# Patient Record
Sex: Male | Born: 1970 | Race: White | Hispanic: No | Marital: Married
Health system: Southern US, Community
[De-identification: ages and names within clinical notes are randomized; demographics above are authoritative.]

---

## 2007-07-28 ENCOUNTER — Emergency Department: Payer: Self-pay | Admitting: Emergency Medicine

## 2007-08-08 ENCOUNTER — Ambulatory Visit (HOSPITAL_BASED_OUTPATIENT_CLINIC_OR_DEPARTMENT_OTHER): Admission: RE | Admit: 2007-08-08 | Discharge: 2007-08-08 | Payer: Self-pay | Admitting: Orthopedic Surgery

## 2007-08-18 ENCOUNTER — Other Ambulatory Visit: Payer: Self-pay

## 2007-08-19 ENCOUNTER — Inpatient Hospital Stay: Payer: Self-pay | Admitting: Internal Medicine

## 2007-08-22 ENCOUNTER — Other Ambulatory Visit: Payer: Self-pay

## 2008-03-26 ENCOUNTER — Ambulatory Visit: Payer: Self-pay | Admitting: Vascular Surgery

## 2010-01-21 IMAGING — CT CT CHEST W/ CM
1 series · 15 of 32 positions shown, 19 images · non-contrast
Comparison: none

REASON FOR EXAM: cp sob known dvt
COMMENTS:

[Series 5: soft tissue · axial · 0.74mm/px · z∈[+974,+1280]mm · 15 of 116 slices shown, 19 images]
[im 9/116  mediastinal]
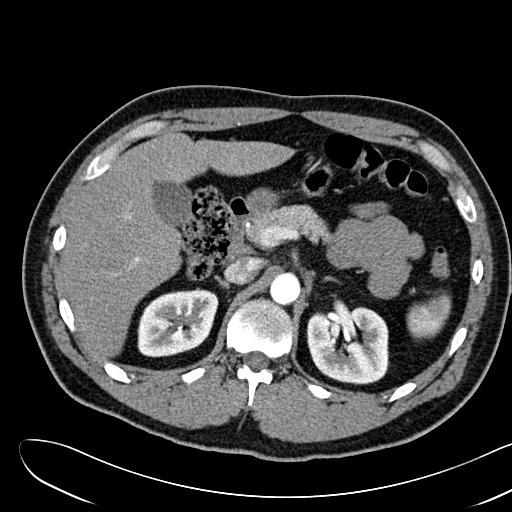
[im 9/116  lung]
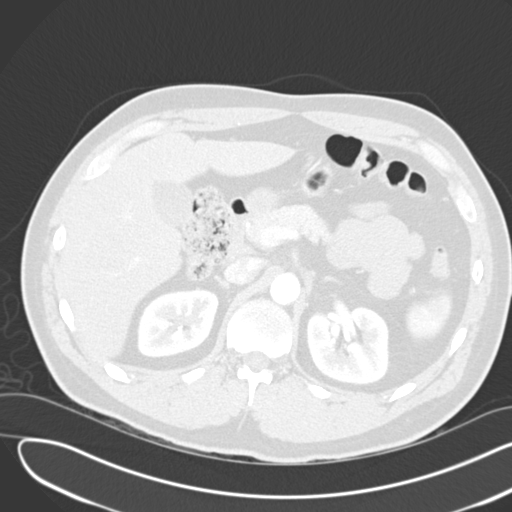
[im 18/116  lung]
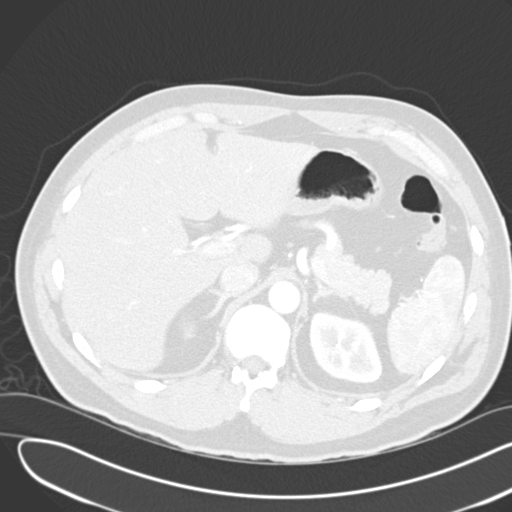
[im 24/116  lung]
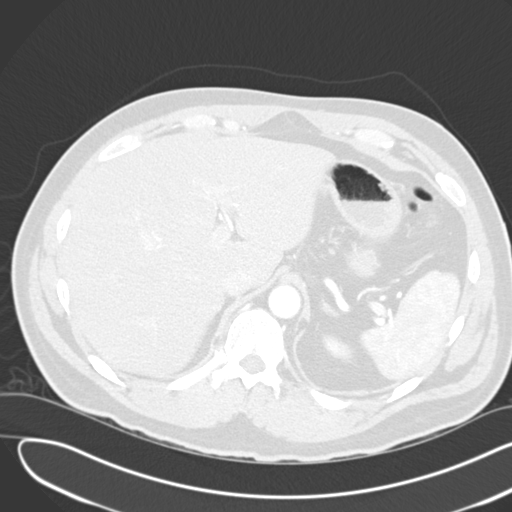
[im 30/116  lung]
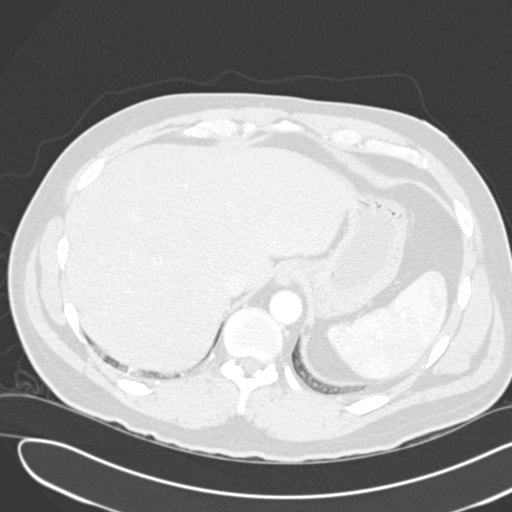
[im 39/116  mediastinal]
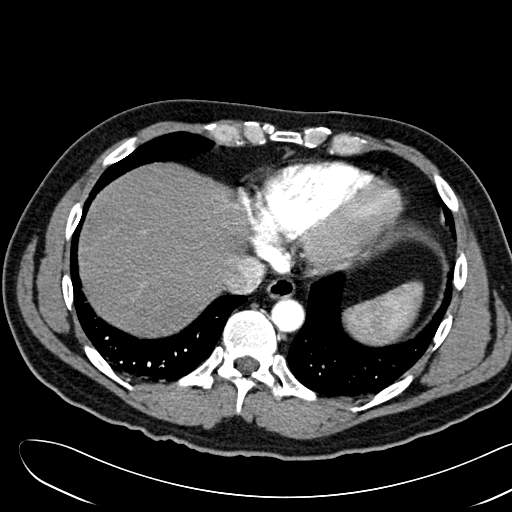
[im 39/116  lung]
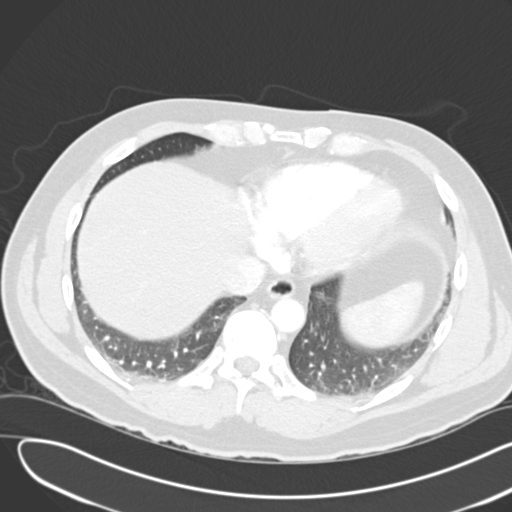
[im 47/116  lung]
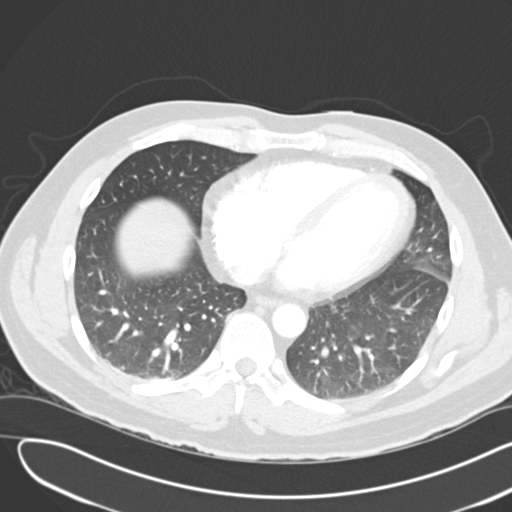
[im 56/116  lung]
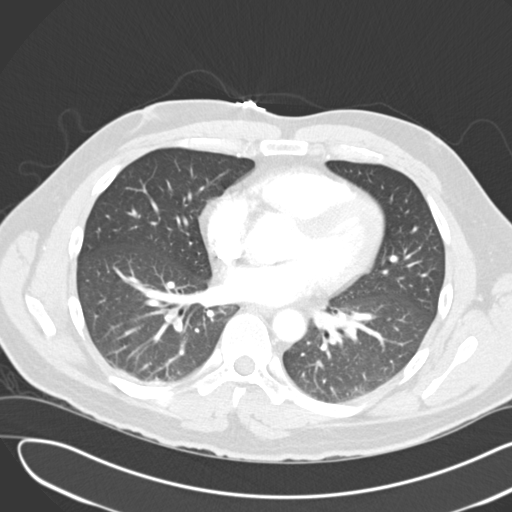
[im 61/116  lung]
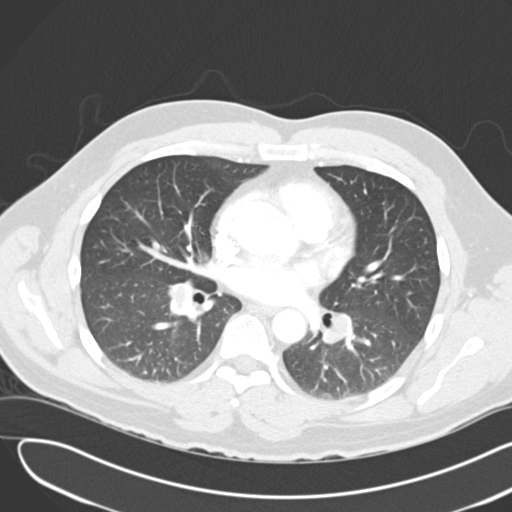
[im 69/116  mediastinal]
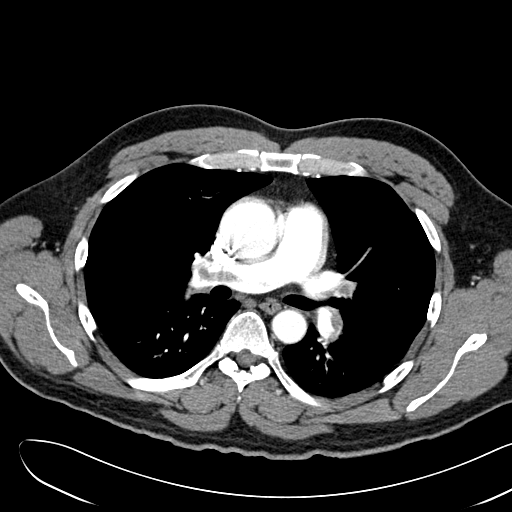
[im 69/116  lung]
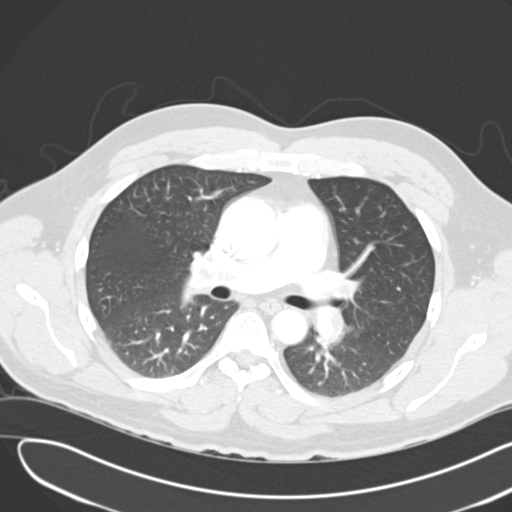
[im 73/116  lung]
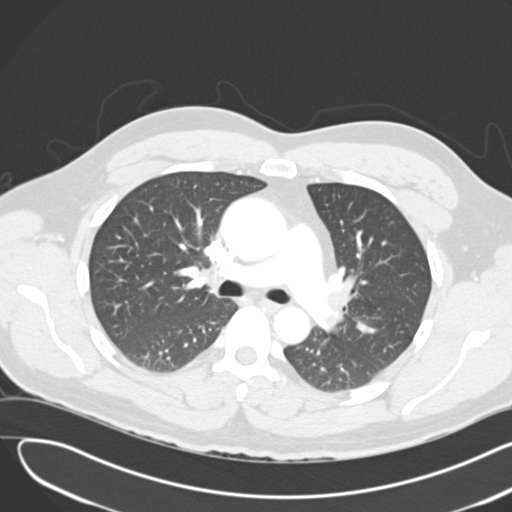
[im 81/116  lung]
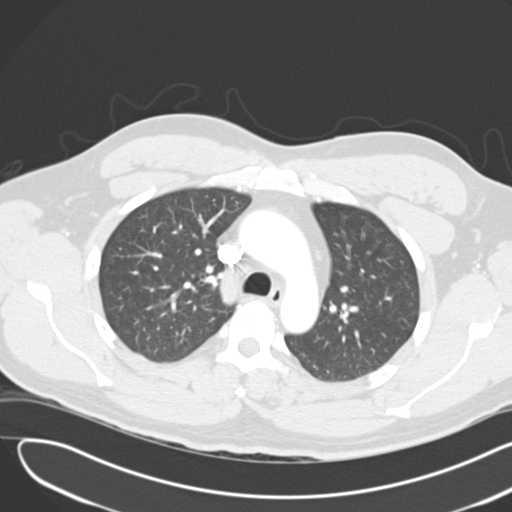
[im 90/116  lung]
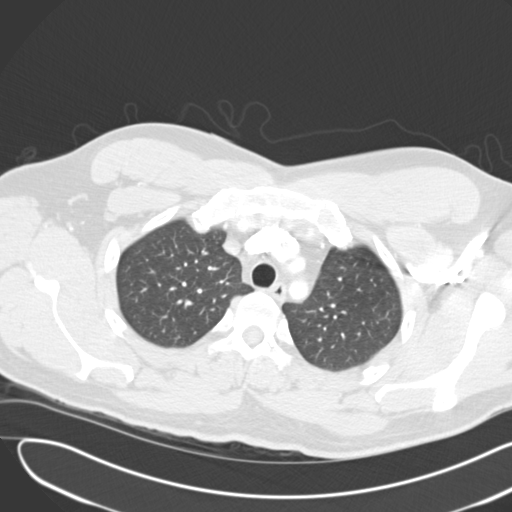
[im 94/116  mediastinal]
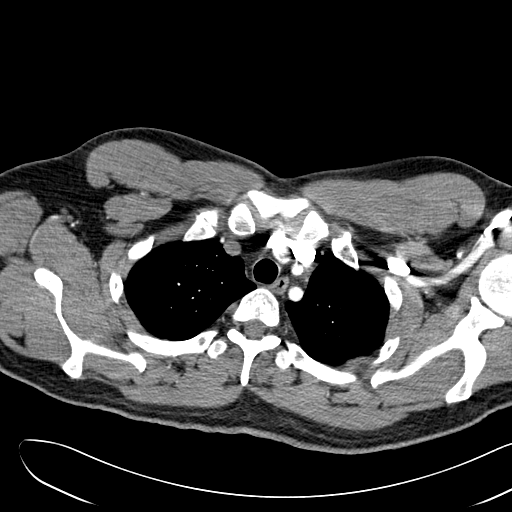
[im 94/116  lung]
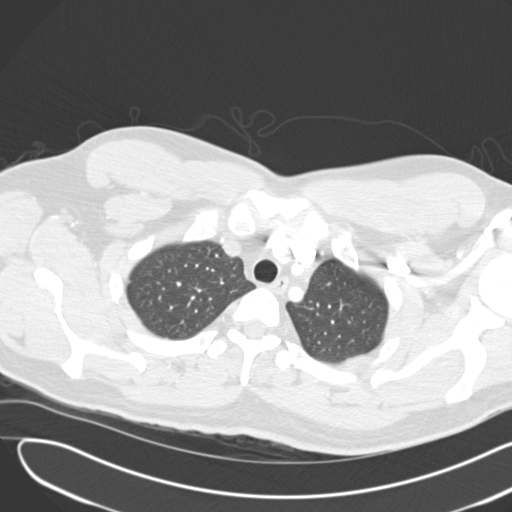
[im 103/116  lung]
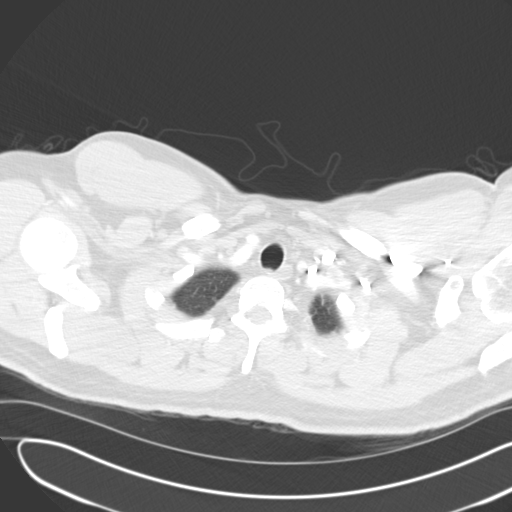
[im 111/116  lung]
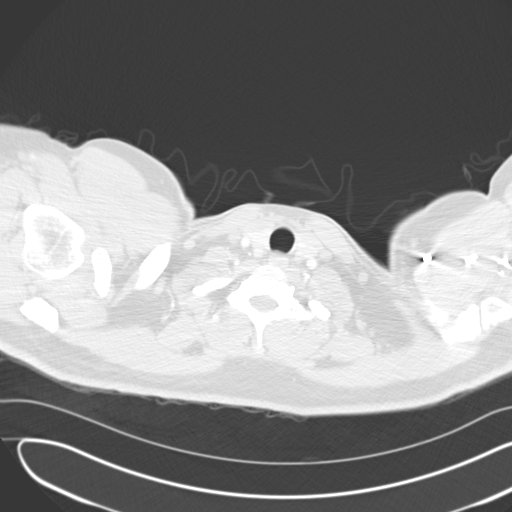

[15 of 32 positions shown; findings below may reference images not displayed]

PROCEDURE:     CT  - CT CHEST (FOR PE) W  - August 19, 2007 [DATE]

RESULT:     The patient received 125 ml of Ksovue-KBZ. The patient is
postoperative from LEFT foot surgery and is complaining of dyspnea and
shortness of breath.

There are filling defects within the central and more peripheral pulmonary
arterial tree bilaterally consistent with acute pulmonary embolism. At lung
window settings there is no evidence of pulmonary infarction. There is no
alveolar or interstitial infiltrate otherwise either. The cardiac chambers
are top normal in size. The caliber of the ascending thoracic aorta is
mildly dilated at 4.3 cm at the aortic root. The aortic arch and descending
thoracic aorta are normal in appearance. I see no pleural or pericardial
effusions. Within the upper abdomen the observed portions of the liver and
spleen are normal. The kidneys enhance well where visualized.
IMPRESSION: CV:1.There are bilateral central and peripheral pulmonary emboli. A very
thin filling defect extending into both main pulmonary arteries is seen
consistent with a small saddle embolus. I do not see objective evidence of
pulmonary infarction currently. A trace of atelectasis in the posterior
costophrenic gutter on the RIGHT is noted.
2. There is mild dilation of the aortic root at 4.3 cm.
3. I see no acute abnormality elsewhere within the thorax.

A preliminary report was sent to the [HOSPITAL] the conclusion
of the study.

## 2010-10-20 NOTE — Op Note (Signed)
NAMERACER, QUAM NO.:  [id number]   MEDICAL RECORD NO.:  [id number]          PATIENT TYPE:  AMB   LOCATION:  DSC                          FACILITY:  [facility]   PHYSICIAN:  Leonides Grills, M.D.     DATE OF BIRTH:  [birth year]   DATE OF PROCEDURE:  08/08/2007  DATE OF DISCHARGE:                               OPERATIVE REPORT   PREOPERATIVE DIAGNOSIS:  Left Achilles tendon rupture.   POSTOPERATIVE DIAGNOSIS:  Left Achilles tendon rupture.   OPERATIONS:  1. Left primary repair to Achilles tendon.  2. Left plantaris to Achilles tendon transfer.   ANESTHESIA:  General.   SURGEON:  Durene Romans. Lestine Box, MD   ASSISTANT:  Richardean Canal, P.A.   ESTIMATED BLOOD LOSS:  Minimal.   TOURNIQUET TIME:  Approximately one hour.   COMPLICATIONS:  None.   DISPOSITION:  Stable to PR.   INDICATIONS:  This is a 40 year old male who has sustained the above  injury.  He was sent for the above procedure.  All risks including  infection, neurovessel injury, re-rupture, persistent pain, weakness,  contracture were all explained and questions were encouraged and  answered.   DESCRIPTION OF PROCEDURE:  The patient was brought to the operating room  and placed in supine position, after adequate general endotracheal tube  anesthesia was administered as well as Ancef gram IV piggyback.  He was  placed into a full-lateral position with the operative site down on a  bean bag.  All bony prominences were well padded, and the left lower  extremity was then prepped and draped in a sterile manner, with a  proximally placed thigh tourniquet.  The limb was gravity exsanguinated  and the tourniquet was elevated to 290 mmHg.  A longitudinal incision on  the anteromedial aspect of the left Achilles tendon was then made.  Dissection was carried down through skin.  Hemostasis was obtained.  The  fascia was opened in line with the incision.  The Achilles tendon  ruptured area was found, and the  plantaris was also found as well; this  was intact.  We dissected out the plantaris for later transfer.  We then  carefully dissected the Achilles tendon rupture, and then with a #5  FiberWire with a modified Bunnell stitch repaired end-to-end the  Achilles tendon rupture.  This had an Conservation officer, historic buildings.   The remaining part of the Achilles tendon mopped areas were repaired  with 2-0 Vicryl stitch, and then the plantaris tendon was transferred to  the Achilles tendon and then using 2-0 FiberWire stitch.  This had an  Conservation officer, historic buildings.   The construct was ranged and had good range of motion as well; and  appeared to be strong and intact.  The area was copiously irrigated with  normal saline.  The tourniquet was deflated.  Hemostasis was obtained.  The subcutaneous was closed with 3-0 Vicryl.  The skin was closed with 4-  0 nylon.  Sterile dressing was applied.  Modified Jones dressing was  applied, with the ankle in gravity equinus.  The patient was stable to  the PR.  Leonides Grills, M.D.  Electronically Signed     PB/MEDQ  D:  08/08/2007  T:  08/08/2007  Job:  [id number]

## 2012-04-25 ENCOUNTER — Emergency Department: Payer: Self-pay | Admitting: Emergency Medicine

## 2014-09-28 IMAGING — US US EXTREM LOW VENOUS*L*
1 series · 14 of 24 positions shown · non-contrast
Comparison: none

REASON FOR EXAM: pain/edema left calf. PMH of DVT
COMMENTS:   LMP: (Male)

[Series 1: us extrem low venous*left* · 0.11mm/px · 14 of 24 slices shown]
[im 1/24]
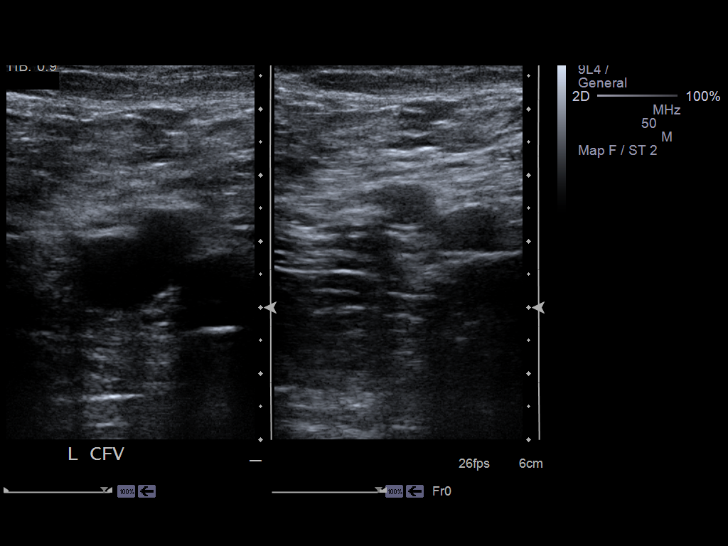
[im 3/24]
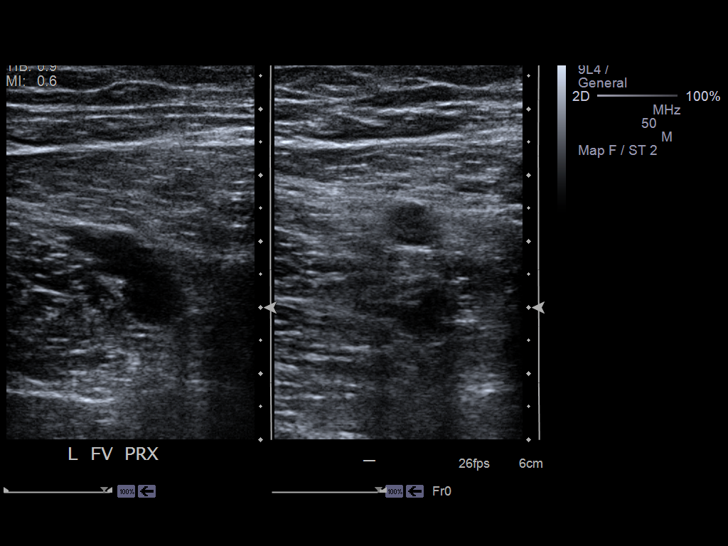
[im 5/24]
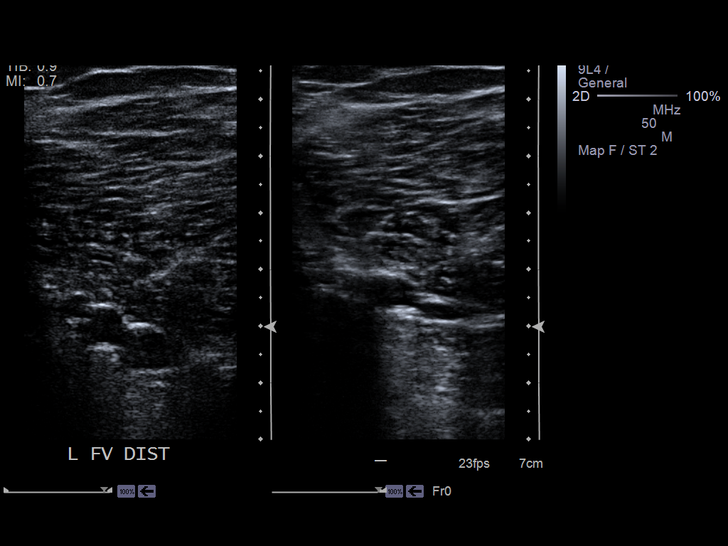
[im 7/24]
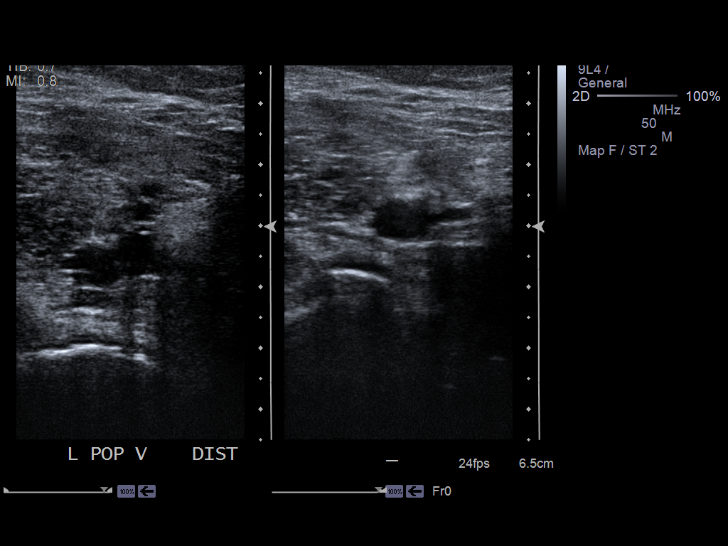
[im 8/24]
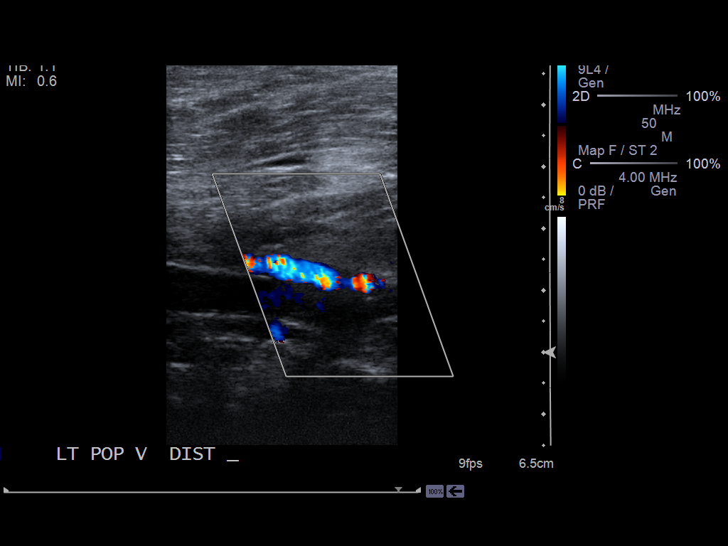
[im 10/24]
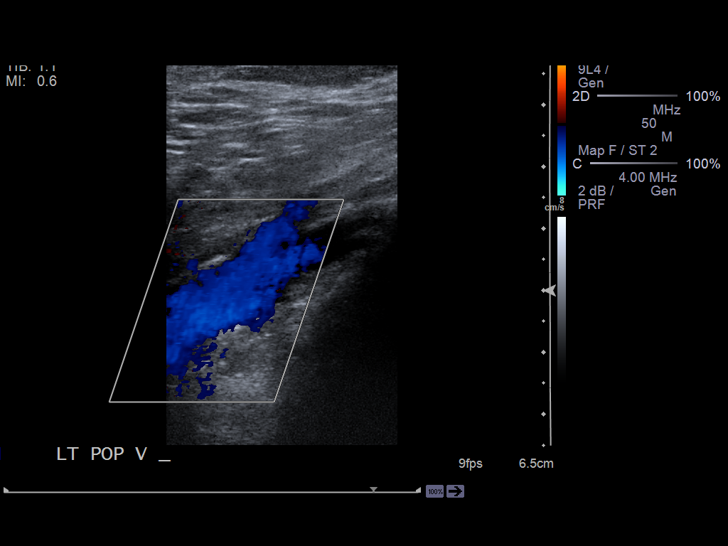
[im 12/24]
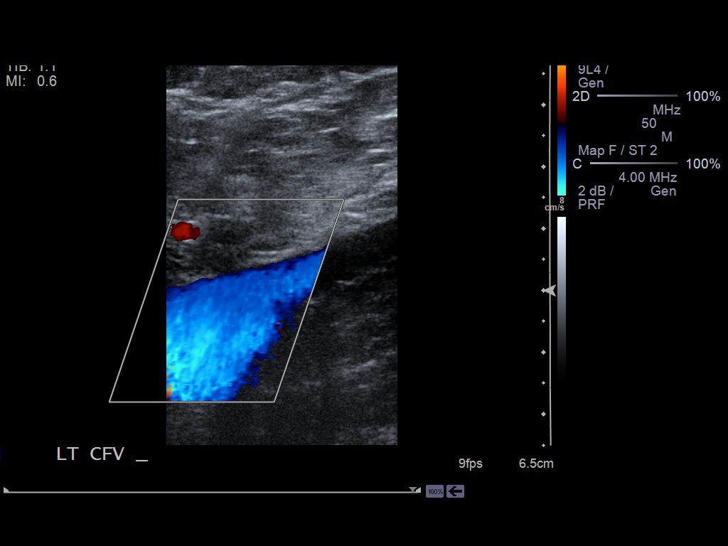
[im 13/24]
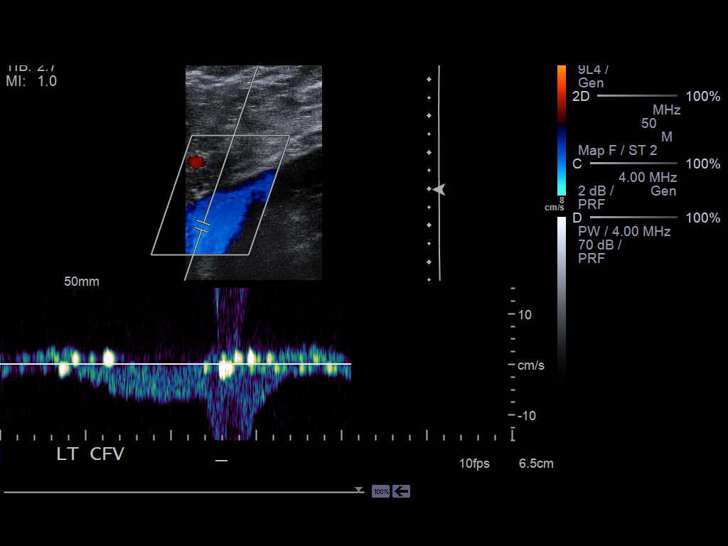
[im 15/24]
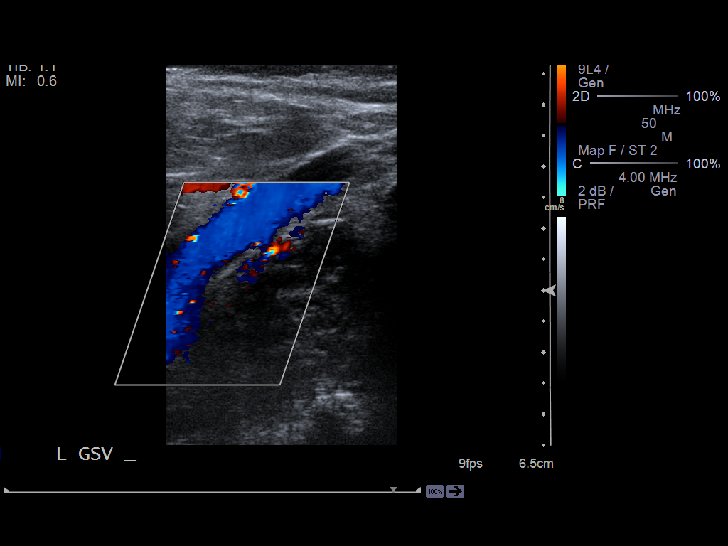
[im 17/24]
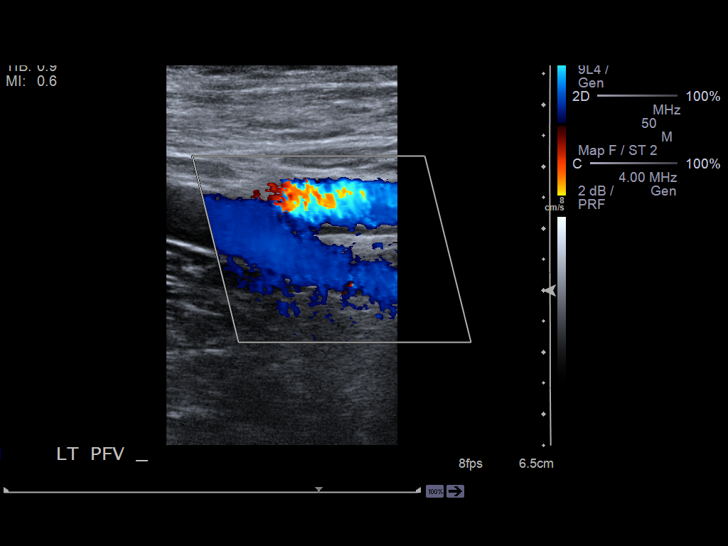
[im 19/24]
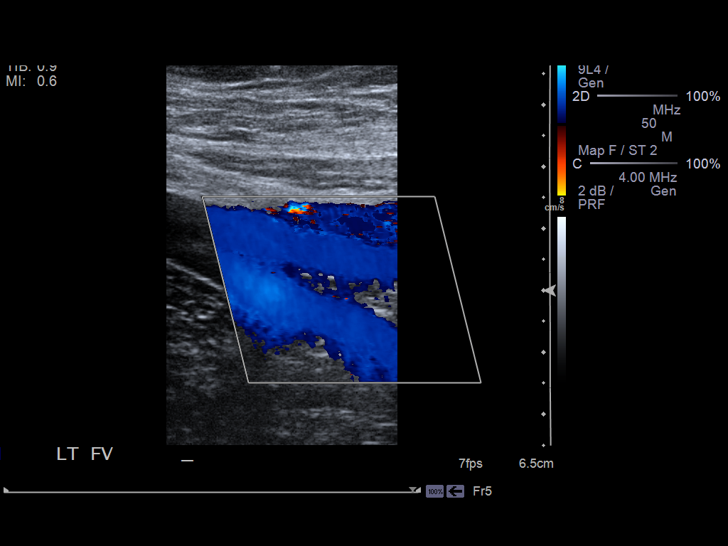
[im 20/24]
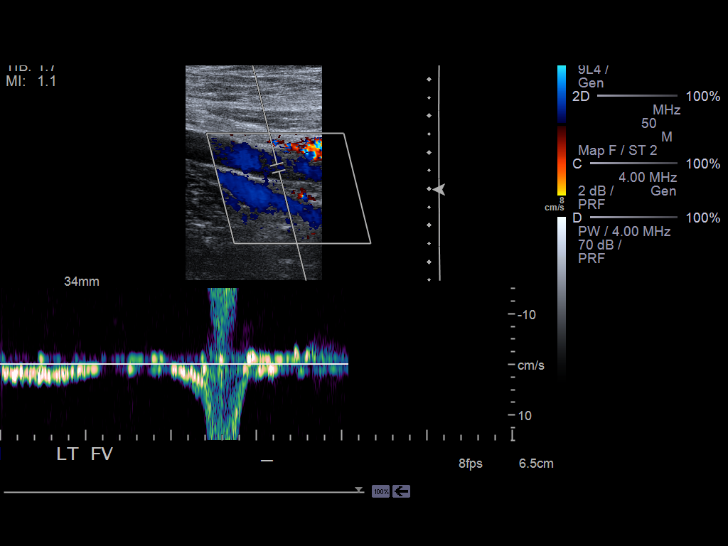
[im 22/24]
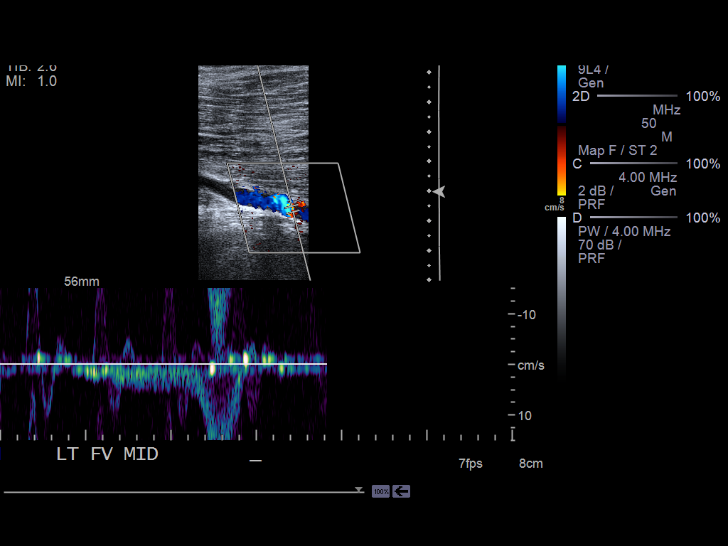
[im 24/24]
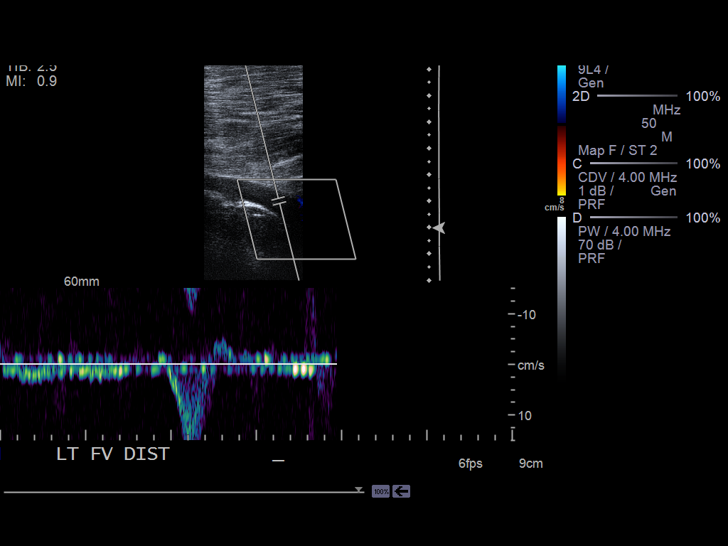

[14 of 24 positions shown; findings below may reference images not displayed]

PROCEDURE:     US  - US DOPPLER LOW EXTR LEFT  - April 25, 2012 [DATE]

RESULT:     Comparison: None

Technique and findings: Multiple longitudinal and transverse grayscale as
well as color and spectral Doppler images of the left lower extremity veins
were obtained from the common femoral veins through the popliteal veins.

The left common femoral, femoral, and popliteal veins are patent,
demonstrating normal color-flow and compressibility. No intraluminal
thrombus is identified.  There is normal respiratory variation and
augmentation demonstrated at all vein levels.
IMPRESSION: No evidence of DVT in the left lower extremity.

[REDACTED]

## 2015-04-25 ENCOUNTER — Encounter: Payer: Self-pay | Admitting: Physician Assistant

## 2015-04-25 ENCOUNTER — Ambulatory Visit: Payer: Self-pay | Admitting: Physician Assistant

## 2015-04-25 VITALS — BP 130/90 | HR 83 | Temp 98.5°F

## 2015-04-25 DIAGNOSIS — J019 Acute sinusitis, unspecified: Secondary | ICD-10-CM

## 2015-04-25 MED ORDER — AMOXICILLIN 875 MG PO TABS: 875.0000 mg | ORAL_TABLET | Freq: Two times a day (BID) | ORAL | Status: DC

## 2015-04-25 NOTE — Progress Notes (Signed)
S/ one wk hx nasal congestion , pressure blowing green , malaise ST ,  O/ VSS mildly ill appearing tms with fluid , nasal mucosa very red , inflames with purulent d/c , turbinates boggy , + frontomaxillary tenderness  Throat clear neck supple heart rsr lungs clear  A. rhinosinusitis P / amoxicillan , supportive care,  Nasal saline products bid and prn

## 2015-07-23 ENCOUNTER — Encounter: Payer: Self-pay | Admitting: Physician Assistant

## 2015-07-23 ENCOUNTER — Ambulatory Visit: Payer: Self-pay | Admitting: Physician Assistant

## 2015-07-23 VITALS — BP 129/80 | HR 82 | Temp 98.3°F

## 2015-07-23 DIAGNOSIS — J069 Acute upper respiratory infection, unspecified: Secondary | ICD-10-CM

## 2015-07-23 MED ORDER — CEFDINIR 300 MG PO CAPS: 300.0000 mg | ORAL_CAPSULE | Freq: Two times a day (BID) | ORAL | Status: DC

## 2015-07-23 NOTE — Progress Notes (Signed)
S: C/o runny nose and congestion for 3 days, ? fever, chills, and body aches, denies cp/sob, v/d; mucus was green this am but clear throughout the day, cough is sporadic, cough is worse at night, did get flu vaccine   O: PE: vitals wnl, nad,  perrl eomi, normocephalic, tms dull, nasal mucosa red and swollen, throat injected, neck supple no lymph, lungs c t a, cv rrr, neuro intact  A:  Acute uri   P: omnicef, otc delsym, if cough is worsening can call in tessalon, drink fluids, continue regular meds , use otc meds of choice, return if not improving in 5 days, return earlier if worsening

## 2015-08-13 ENCOUNTER — Ambulatory Visit: Payer: Self-pay | Admitting: Registered Nurse

## 2015-08-13 ENCOUNTER — Encounter: Payer: Self-pay | Admitting: Registered Nurse

## 2015-08-13 VITALS — BP 128/90 | HR 82 | Temp 98.8°F

## 2015-08-13 DIAGNOSIS — IMO0001 Reserved for inherently not codable concepts without codable children: Secondary | ICD-10-CM | POA: Insufficient documentation

## 2015-08-13 DIAGNOSIS — E785 Hyperlipidemia, unspecified: Secondary | ICD-10-CM | POA: Insufficient documentation

## 2015-08-13 DIAGNOSIS — L255 Unspecified contact dermatitis due to plants, except food: Secondary | ICD-10-CM

## 2015-08-13 DIAGNOSIS — I10 Essential (primary) hypertension: Secondary | ICD-10-CM | POA: Insufficient documentation

## 2015-08-13 DIAGNOSIS — I2699 Other pulmonary embolism without acute cor pulmonale: Secondary | ICD-10-CM | POA: Insufficient documentation

## 2015-08-13 DIAGNOSIS — G4730 Sleep apnea, unspecified: Secondary | ICD-10-CM | POA: Insufficient documentation

## 2015-08-13 DIAGNOSIS — S86019A Strain of unspecified Achilles tendon, initial encounter: Secondary | ICD-10-CM | POA: Insufficient documentation

## 2015-08-13 DIAGNOSIS — IMO0002 Reserved for concepts with insufficient information to code with codable children: Secondary | ICD-10-CM | POA: Insufficient documentation

## 2015-08-13 MED ORDER — PREDNISONE 10 MG PO TABS: 60.0000 mg | ORAL_TABLET | Freq: Every day | ORAL | Status: AC

## 2015-08-13 NOTE — Progress Notes (Signed)
Subjective:    Patient ID: Ronald Christensen, male    DOB: [birth year], 45 y.o.   MRN: [id number]  HPI Comments: Married caucasian male helped with yard work at [employer] two weeks ago wore gloves/pants and short sleeve shirt noticed rash on bilateral arms started at wrists and has spread to shoulders bilaterally and abdomen.  Clear discharge/pimples noted by patient some grouped.  Has tried calamine lotion, benadryl 12.5mg  po prn, poison ivy scrub without improvement of symptoms.  Required po steroids a couple years ago for similar spread of rash.  Thinks this is poison ivy but didn't recognize plant in area he was working outside.  Poison [place] This is a recurrent problem. The current episode started 1 to 4 weeks ago. The problem has been gradually worsening since onset. The affected locations include the abdomen, left arm and right arm. The rash is characterized by redness, itchiness, draining, swelling and scaling. He was exposed to plant contact. Pertinent negatives include no anorexia, congestion, cough, diarrhea, eye pain, facial edema, fatigue, fever, joint pain, nail changes, rhinorrhea, shortness of breath, sore throat or vomiting. Past treatments include analgesics, antihistamine, anti-itch cream, moisturizer and cold compress. The treatment provided mild relief. His past medical history is significant for allergies and varicella. There is no history of asthma or eczema.      Review of Systems  Constitutional: Negative for fever, chills, diaphoresis, activity change, appetite change, fatigue and unexpected weight change.  HENT: Negative for congestion, dental problem, drooling, ear discharge, ear pain, facial swelling, hearing loss, mouth sores, nosebleeds, postnasal drip, rhinorrhea, sinus pressure, sneezing, sore throat, tinnitus, trouble swallowing and voice change.   Eyes: Negative for photophobia, pain, discharge, redness, itching and visual disturbance.  Respiratory: Negative for  cough, choking, chest tightness, shortness of breath, wheezing and stridor.   Cardiovascular: Negative for chest pain, palpitations and leg swelling.  Gastrointestinal: Negative for nausea, vomiting, abdominal pain, diarrhea, constipation, blood in stool, abdominal distention and anorexia.  Endocrine: Negative for cold intolerance and heat intolerance.  Genitourinary: Negative for dysuria.  Musculoskeletal: Negative for myalgias, back pain, joint pain, joint swelling, arthralgias, gait problem, neck pain and neck stiffness.  Skin: Positive for color change and rash. Negative for nail changes, pallor and wound.  Allergic/Immunologic: Positive for environmental allergies. Negative for food allergies and immunocompromised state.  Neurological: Negative for dizziness, tremors, seizures, syncope, facial asymmetry, speech difficulty, weakness, light-headedness, numbness and headaches.  Hematological: Negative for adenopathy. Does not bruise/bleed easily.  Psychiatric/Behavioral: Negative for behavioral problems, confusion, sleep disturbance and agitation.       Objective:   Physical Exam  Constitutional: He is oriented to person, place, and time. Vital signs are normal. He appears well-developed and well-nourished. He is active and cooperative.  Non-toxic appearance. He does not have a sickly appearance. He does not appear ill. No distress.  HENT:  Head: Normocephalic and atraumatic.  Right Ear: Hearing, external ear and ear canal normal.  Left Ear: Hearing, external ear and ear canal normal.  Nose: Mucosal edema and rhinorrhea present. No nose lacerations, sinus tenderness, nasal deformity, septal deviation or nasal septal hematoma. No epistaxis.  No foreign bodies. Right sinus exhibits no maxillary sinus tenderness and no frontal sinus tenderness. Left sinus exhibits no maxillary sinus tenderness and no frontal sinus tenderness.  Mouth/Throat: Uvula is midline and mucous membranes are normal.  Mucous membranes are not pale, not dry and not cyanotic. He does not have dentures. No oral lesions. No trismus in the jaw. Normal  dentition. No dental abscesses, uvula swelling, lacerations or dental caries. Posterior oropharyngeal edema and posterior oropharyngeal erythema present. No oropharyngeal exudate or tonsillar abscesses.  Cobblestoning posterior pharynx; bilateral nasal turbinates with edema/erythema  Eyes: Conjunctivae, EOM and lids are normal. Pupils are equal, round, and reactive to light. Right eye exhibits no chemosis, no discharge, no exudate and no hordeolum. No foreign body present in the right eye. Left eye exhibits no chemosis, no discharge, no exudate and no hordeolum. No foreign body present in the left eye. Right conjunctiva is not injected. Right conjunctiva has no hemorrhage. Left conjunctiva is not injected. Left conjunctiva has no hemorrhage. No scleral icterus. Right eye exhibits normal extraocular motion and no nystagmus. Left eye exhibits normal extraocular motion and no nystagmus. Right pupil is round and reactive. Left pupil is round and reactive. Pupils are equal.  Neck: Trachea normal and normal range of motion. Neck supple. No tracheal tenderness, no spinous process tenderness and no muscular tenderness present. No rigidity. No tracheal deviation, no edema, no erythema and normal range of motion present. No thyroid mass and no thyromegaly present.  Cardiovascular: Normal rate, regular rhythm, S1 normal, S2 normal, normal heart sounds and intact distal pulses.  PMI is not displaced.  Exam reveals no gallop and no friction rub.   No murmur heard. Pulmonary/Chest: Effort normal and breath sounds normal. No stridor. No respiratory distress. He has no decreased breath sounds. He has no wheezes. He has no rhonchi. He has no rales.  Abdominal: Soft. He exhibits no distension.  Musculoskeletal: Normal range of motion. He exhibits no edema or tenderness.  Lymphadenopathy:        Head (right side): No submental, no submandibular, no tonsillar, no preauricular, no posterior auricular and no occipital adenopathy present.       Head (left side): No submental, no submandibular, no tonsillar, no preauricular, no posterior auricular and no occipital adenopathy present.    He has no cervical adenopathy.       Right cervical: No superficial cervical, no deep cervical and no posterior cervical adenopathy present.      Left cervical: No superficial cervical, no deep cervical and no posterior cervical adenopathy present.  Neurological: He is alert and oriented to person, place, and time. He displays no atrophy and no tremor. No cranial nerve deficit or sensory deficit. He exhibits normal muscle tone. He displays no seizure activity. Coordination and gait normal. GCS eye subscore is 4. GCS verbal subscore is 5. GCS motor subscore is 6.  Skin: Skin is warm, dry and intact. Rash noted. No abrasion, no bruising, no burn, no ecchymosis, no laceration, no lesion, no petechiae and no purpura noted. Rash is macular, papular and maculopapular. Rash is not nodular, not pustular, not vesicular and not urticarial. He is not diaphoretic. There is erythema. No cyanosis. No pallor. Nails show no clubbing.     Bilateral arms with grouped dry erythematous macular papular rash largest 3x2cm; nongrouped erythematous papules on abdomen scant  Psychiatric: He has a normal mood and affect. His speech is normal and behavior is normal. Judgment and thought content normal. Cognition and memory are normal.  Nursing note and vitals reviewed.         Assessment & Plan:  A-contact dermatitis due to plants P-60mg  prednisone po taper x 21 days  Widespread greater than 20% body surface area.  1-2mg /kg Prednisone (max ) for 7-10 days and taper over next 7-10 days per Up to Date.  Symptomatic therapy suggested e.g. Calamine  lotion prn affected areas, benadryl 25mg  po TID prn or OTC zyrtec 10mg  po BID.  Warm to  cool water soaks and/or oatmeal baths.  Call or return to clinic as needed if these symptoms worsen or fail to improve as anticipated especially lesions noted on eye, visual changes or visual loss.  Discussed avoidance/no contact wear of long sleeves/pants/socks/gloves and handkerchief around neck/mouth/face and use of poison ivy block cream along with tepid shower immediately after completion of yard work/playing in yard.  Keep poison ivy block lotion and soap at home as exposure likely to occur again. Avoid scratching lesions to prevent secondary infections.  May apply ice to itchy areas if po/topical meds not yet active systemically or wearing off prior to next dose.  Patient verbalized agreement and understanding of treatment plan and had no further questions at this time.   P2:  Avoidance and hand washing.

## 2015-11-21 ENCOUNTER — Ambulatory Visit: Payer: Self-pay | Admitting: Physician Assistant

## 2015-11-21 ENCOUNTER — Encounter: Payer: Self-pay | Admitting: Physician Assistant

## 2015-11-21 VITALS — BP 120/70 | HR 78 | Temp 98.4°F | Ht 71.0 in | Wt 264.0 lb

## 2015-11-21 DIAGNOSIS — Z0289 Encounter for other administrative examinations: Secondary | ICD-10-CM

## 2015-11-21 DIAGNOSIS — Z299 Encounter for prophylactic measures, unspecified: Secondary | ICD-10-CM

## 2015-11-21 NOTE — Progress Notes (Signed)
S: here for physical for [employer], papers filled out, no complaints, tdap utd, hx of htn and hyperlipidemia, meds as listed  O: vitals wnl, nad, ENT wnl, neck supple no lymph, lungs c t a, cv rrr, abd soft nontender, neuro intact, moves all extremeties without difficulty  A: well adult with htn and hyperlipidemia  P: continue regular meds, instructed pt to drink plenty of water as simvastatin and fenofibrate together increase his chance of getting rhabdomyalisis, forms filled out

## 2015-11-22 LAB — CMP12+LP+TP+TSH+6AC+CBC/D/PLT
ALBUMIN: 4.6 g/dL (ref 3.5–5.5)
ALK PHOS: 50 IU/L (ref 39–117)
ALT: 42 IU/L (ref 0–44)
AST: 24 IU/L (ref 0–40)
Albumin/Globulin Ratio: 2.6 — ABNORMAL HIGH (ref 1.2–2.2)
BASOS: 1 %
BUN/Creatinine Ratio: 16 (ref 9–20)
BUN: 15 mg/dL (ref 6–24)
Basophils Absolute: 0 10*3/uL (ref 0.0–0.2)
Bilirubin Total: 0.4 mg/dL (ref 0.0–1.2)
CALCIUM: 9.9 mg/dL (ref 8.7–10.2)
CHLORIDE: 100 mmol/L (ref 96–106)
CHOL/HDL RATIO: 4.9 ratio (ref 0.0–5.0)
CREATININE: 0.95 mg/dL (ref 0.76–1.27)
Cholesterol, Total: 206 mg/dL — ABNORMAL HIGH (ref 100–199)
EOS (ABSOLUTE): 0.3 10*3/uL (ref 0.0–0.4)
ESTIMATED CHD RISK: 1 times avg. (ref 0.0–1.0)
Eos: 5 %
Free Thyroxine Index: 1.7 (ref 1.2–4.9)
GFR, EST AFRICAN AMERICAN: 110 mL/min/{1.73_m2} (ref >59)
GFR, EST NON AFRICAN AMERICAN: 95 mL/min/{1.73_m2} (ref >59)
GGT: 53 IU/L (ref 0–65)
Globulin, Total: 1.8 g/dL (ref 1.5–4.5)
Glucose: 126 mg/dL — ABNORMAL HIGH (ref 65–99)
HDL: 42 mg/dL (ref >39)
HEMATOCRIT: 43.9 % (ref 37.5–51.0)
Hemoglobin: 14.4 g/dL (ref 12.6–17.7)
IMMATURE GRANS (ABS): 0 10*3/uL (ref 0.0–0.1)
Immature Granulocytes: 0 %
Iron: 127 ug/dL (ref 38–169)
LDH: 160 IU/L (ref 121–224)
LDL CALC: 119 mg/dL — AB (ref 0–99)
Lymphocytes Absolute: 2.2 10*3/uL (ref 0.7–3.1)
Lymphs: 40 %
MCH: 30.6 pg (ref 26.6–33.0)
MCHC: 32.8 g/dL (ref 31.5–35.7)
MCV: 93 fL (ref 79–97)
MONOCYTES: 8 %
Monocytes Absolute: 0.4 10*3/uL (ref 0.1–0.9)
NEUTROS ABS: 2.6 10*3/uL (ref 1.4–7.0)
Neutrophils: 46 %
PHOSPHORUS: 3.6 mg/dL (ref 2.5–4.5)
POTASSIUM: 4.4 mmol/L (ref 3.5–5.2)
Platelets: 283 10*3/uL (ref 150–379)
RBC: 4.7 x10E6/uL (ref 4.14–5.80)
RDW: 13.5 % (ref 12.3–15.4)
SODIUM: 140 mmol/L (ref 134–144)
T3 Uptake Ratio: 27 % (ref 24–39)
T4, Total: 6.4 ug/dL (ref 4.5–12.0)
TSH: 3.28 u[IU]/mL (ref 0.450–4.500)
Total Protein: 6.4 g/dL (ref 6.0–8.5)
Triglycerides: 223 mg/dL — ABNORMAL HIGH (ref 0–149)
Uric Acid: 7.8 mg/dL (ref 3.7–8.6)
VLDL Cholesterol Cal: 45 mg/dL — ABNORMAL HIGH (ref 5–40)
WBC: 5.5 10*3/uL (ref 3.4–10.8)

## 2015-11-22 LAB — MICROALBUMIN / CREATININE URINE RATIO
CREATININE, UR: 123.9 mg/dL
MICROALB/CREAT RATIO: <2.4 mg/g creat (ref 0.0–30.0)
Microalbumin, Urine: <3 ug/mL

## 2016-01-06 DIAGNOSIS — I10 Essential (primary) hypertension: Secondary | ICD-10-CM | POA: Insufficient documentation

## 2016-07-28 ENCOUNTER — Ambulatory Visit: Payer: Self-pay | Admitting: Physician Assistant

## 2016-09-27 ENCOUNTER — Ambulatory Visit: Payer: Self-pay | Admitting: Physician Assistant

## 2016-09-29 ENCOUNTER — Ambulatory Visit: Payer: Self-pay | Admitting: Physician Assistant

## 2016-09-29 VITALS — BP 140/89 | HR 73 | Temp 97.9°F | Ht 72.0 in | Wt 258.0 lb

## 2016-09-29 DIAGNOSIS — Z008 Encounter for other general examination: Secondary | ICD-10-CM

## 2016-09-29 DIAGNOSIS — Z0189 Encounter for other specified special examinations: Principal | ICD-10-CM

## 2016-09-29 NOTE — Progress Notes (Signed)
S: pt here for biometrics for insurance purposes, no complaints ros neg. Needs [employer] clearance form filled out, had a physical with his pcp  O: vitals wnl, nad, ENT wnl, neck supple no lymph, lungs c t a, cv rrr, abd soft nontender bs normal all 4 quads  A:  biometric physical  P: labs ordered for his physician, [facility] [place], [employer] form filled out

## 2016-09-30 LAB — CMP12+LP+TP+TSH+6AC+CBC/D/PLT
ALBUMIN: 4.8 g/dL (ref 3.5–5.5)
ALK PHOS: 64 IU/L (ref 39–117)
ALT: 40 IU/L (ref 0–44)
AST: 27 IU/L (ref 0–40)
Albumin/Globulin Ratio: 1.9 (ref 1.2–2.2)
BASOS: 0 %
BUN / CREAT RATIO: 20 (ref 9–20)
BUN: 18 mg/dL (ref 6–24)
Basophils Absolute: 0 10*3/uL (ref 0.0–0.2)
Bilirubin Total: 0.5 mg/dL (ref 0.0–1.2)
CALCIUM: 9.9 mg/dL (ref 8.7–10.2)
CHLORIDE: 96 mmol/L (ref 96–106)
CHOL/HDL RATIO: 5.1 ratio — AB (ref 0.0–5.0)
CHOLESTEROL TOTAL: 197 mg/dL (ref 100–199)
CREATININE: 0.89 mg/dL (ref 0.76–1.27)
EOS (ABSOLUTE): 0.1 10*3/uL (ref 0.0–0.4)
ESTIMATED CHD RISK: 1 times avg. (ref 0.0–1.0)
Eos: 2 %
FREE THYROXINE INDEX: 2 (ref 1.2–4.9)
GFR calc Af Amer: 117 mL/min/{1.73_m2} (ref >59)
GFR, EST NON AFRICAN AMERICAN: 101 mL/min/{1.73_m2} (ref >59)
GGT: 55 IU/L (ref 0–65)
Globulin, Total: 2.5 g/dL (ref 1.5–4.5)
Glucose: 130 mg/dL — ABNORMAL HIGH (ref 65–99)
HDL: 39 mg/dL — ABNORMAL LOW (ref >39)
HEMATOCRIT: 45.6 % (ref 37.5–51.0)
Hemoglobin: 15.4 g/dL (ref 13.0–17.7)
Immature Grans (Abs): 0 10*3/uL (ref 0.0–0.1)
Immature Granulocytes: 0 %
Iron: 127 ug/dL (ref 38–169)
LDH: 182 IU/L (ref 121–224)
LDL Calculated: 117 mg/dL — ABNORMAL HIGH (ref 0–99)
LYMPHS ABS: 2.1 10*3/uL (ref 0.7–3.1)
Lymphs: 31 %
MCH: 30.7 pg (ref 26.6–33.0)
MCHC: 33.8 g/dL (ref 31.5–35.7)
MCV: 91 fL (ref 79–97)
MONOS ABS: 0.6 10*3/uL (ref 0.1–0.9)
Monocytes: 8 %
Neutrophils Absolute: 4 10*3/uL (ref 1.4–7.0)
Neutrophils: 59 %
Phosphorus: 3.4 mg/dL (ref 2.5–4.5)
Platelets: 305 10*3/uL (ref 150–379)
Potassium: 4.3 mmol/L (ref 3.5–5.2)
RBC: 5.01 x10E6/uL (ref 4.14–5.80)
RDW: 13.9 % (ref 12.3–15.4)
Sodium: 139 mmol/L (ref 134–144)
T3 Uptake Ratio: 25 % (ref 24–39)
T4, Total: 7.8 ug/dL (ref 4.5–12.0)
TOTAL PROTEIN: 7.3 g/dL (ref 6.0–8.5)
TSH: 3.44 u[IU]/mL (ref 0.450–4.500)
Triglycerides: 206 mg/dL — ABNORMAL HIGH (ref 0–149)
Uric Acid: 7.4 mg/dL (ref 3.7–8.6)
VLDL Cholesterol Cal: 41 mg/dL — ABNORMAL HIGH (ref 5–40)
WBC: 6.8 10*3/uL (ref 3.4–10.8)

## 2016-09-30 LAB — MICROALBUMIN / CREATININE URINE RATIO
CREATININE, UR: 32.9 mg/dL
Microalb/Creat Ratio: <9.1 mg/g creat (ref 0.0–30.0)
Microalbumin, Urine: <3 ug/mL

## 2016-12-03 ENCOUNTER — Other Ambulatory Visit: Payer: Self-pay

## 2016-12-03 DIAGNOSIS — Z299 Encounter for prophylactic measures, unspecified: Secondary | ICD-10-CM

## 2016-12-03 NOTE — Progress Notes (Signed)
Patient came in to have blood drawn for testing per Dr. Sionne George's orders. 

## 2016-12-04 LAB — HGB A1C W/O EAG: Hgb A1c MFr Bld: 6.6 % — ABNORMAL HIGH (ref 4.8–5.6)

## 2017-02-09 ENCOUNTER — Ambulatory Visit: Payer: Self-pay | Admitting: Physician Assistant

## 2017-02-09 ENCOUNTER — Encounter: Payer: Self-pay | Admitting: Physician Assistant

## 2017-02-09 VITALS — BP 130/85 | HR 82 | Temp 98.5°F | Resp 16

## 2017-02-09 DIAGNOSIS — W57XXXA Bitten or stung by nonvenomous insect and other nonvenomous arthropods, initial encounter: Secondary | ICD-10-CM

## 2017-02-09 MED ORDER — NAPROXEN 500 MG PO TABS: 500.0000 mg | ORAL_TABLET | Freq: Two times a day (BID) | ORAL | 0 refills | Status: DC

## 2017-02-09 MED ORDER — DOXYCYCLINE MONOHYDRATE 100 MG PO CAPS: 100.0000 mg | ORAL_CAPSULE | Freq: Two times a day (BID) | ORAL | 0 refills | Status: AC

## 2017-02-09 NOTE — Progress Notes (Signed)
   Subjective: Tick bite    Patient ID: Ronald Christensen, male    DOB: 05/09/1971, 46 y.o.   MRN: [id number]  HPI Itching and swelling to penis 2nd to Tick bite two days ago. Patient removed intact Tick yesterday. Awaken with swelling around glan and increased itching. Patient applied alcohol to area after removing Tick.  Denies fever/chill, rash or joint pain.    Review of Systems Hypertension and Hyperlipidema    Objective:   Physical Exam Penile glan edema. No inguinal adenopathy.       Assessment & Plan: Tick Bite  Doxycycline, Naparoxen, and Epsom Salt soak as directed.  Follow up with PCP if condition worsen..Marland Kitchen

## 2017-04-12 ENCOUNTER — Ambulatory Visit: Payer: Self-pay | Admitting: Physician Assistant

## 2017-04-12 ENCOUNTER — Encounter: Payer: Self-pay | Admitting: Physician Assistant

## 2017-04-12 VITALS — BP 120/70 | HR 82 | Temp 98.5°F | Resp 16

## 2017-04-12 DIAGNOSIS — M549 Dorsalgia, unspecified: Secondary | ICD-10-CM

## 2017-04-12 LAB — POCT URINALYSIS DIPSTICK
Bilirubin, UA: NEGATIVE
Glucose, UA: NEGATIVE
Ketones, UA: NEGATIVE
Leukocytes, UA: NEGATIVE
NITRITE UA: NEGATIVE
PROTEIN UA: NEGATIVE
RBC UA: NEGATIVE
UROBILINOGEN UA: 0.2 U/dL
pH, UA: 6 (ref 5.0–8.0)

## 2017-04-12 NOTE — Progress Notes (Signed)
S:  C/o low back pain for several days, no known injury, pain is worse with movement and seems to be worse at night, was worried he had a kidney infection as the pain is in his lower back. Does feel achy. No known fever or chills.  denies numbness, tingling, or changes in bowel/urinary habits,  Using otc meds without relief Remainder ros neg  O:  Vitals wnl, nad, TMs are clear, throat is irritated, neck is supple, lungs are c t a, cv rrr, spine nontender, muscles in lower back spasmed , some tenderness across lower back to SI joints ,  Neg slr, pt walks without difficulty, no foot drop noted, n/v intact, UA is normal  A: acute back pain,   P: use wet heat followed by ice, stretches, return to clinic if not better in 3 t 5 days, return earlier if worsening, we will culture urine

## 2017-04-12 NOTE — Addendum Note (Signed)
Addended by: Catha BrowEACON, MONIQUE T on: 04/12/2017 04:23 PM   Modules accepted: Orders

## 2017-04-14 LAB — URINE CULTURE

## 2017-09-09 ENCOUNTER — Ambulatory Visit: Payer: Self-pay | Admitting: Family Medicine

## 2017-09-09 VITALS — BP 134/82 | HR 70 | Temp 98.1°F | Resp 16 | Ht 72.0 in | Wt 240.0 lb

## 2017-09-09 DIAGNOSIS — I10 Essential (primary) hypertension: Secondary | ICD-10-CM

## 2017-09-09 DIAGNOSIS — Z008 Encounter for other general examination: Secondary | ICD-10-CM

## 2017-09-09 DIAGNOSIS — Z0189 Encounter for other specified special examinations: Principal | ICD-10-CM

## 2017-09-09 LAB — GLUCOSE, POCT (MANUAL RESULT ENTRY): POC GLUCOSE: 107 mg/dL — AB (ref 70–99)

## 2017-09-09 NOTE — Progress Notes (Signed)
Subjective: Annual biometrics screening  Patient presents for his annual biometric screening. Patient has a history of hypertension and hyperlipidemia.  Patient requesting a medical history form be filled out for [employer].  He reports getting clearance for this annually without any restrictions previously.  Reports there are no significant physical demands required during the summer camp.  Patient denies any medical limitations listed on the form as disqualifying.  Patient reports seeing cardiology on Monday for clearance because his brother has a history of a bicuspid aortic valve.  Reports cardiology found no abnormalities and is doing a screening echo to make sure.  Patient denies any symptoms or complaints.  Patient is currently doing the Keto diet and has already lost 25 pounds. [facility] [place]. Patient works with IT. Patient denies any other issues or concerns.   Review of Systems Constitutional: Unremarkable.  HEENT: Unremarkable. Gastrointestinal: Unremarkable. Respiratory: Unremarkable.   Cardiovascular: Unremarkable.  ROS otherwise negative.   Objective  Physical Exam General: Awake, alert and oriented. No acute distress. Well developed, hydrated and nourished. Appears stated age.  HEENT: Supple neck without adenopathy. Sclera is non-icteric. The ear canal is clear without discharge. The tympanic membrane is normal in appearance with normal landmarks and cone of light. Nasal mucosa is pink and moist. Oral mucosa is pink and moist. The pharynx is normal in appearance without tonsillar swelling or exudates.  Skin: Skin in warm, dry and intact without rashes or lesions. Appropriate color for ethnicity. Cardiac: Heart rate and rhythm are normal. No murmurs, gallops, or rubs are auscultated.  Respiratory: The chest wall is symmetric and without deformity. No signs of respiratory distress. Lung sounds are clear in all lobes bilaterally without  rales, ronchi, or wheezes.  Abdominal: Abdomen is soft, symmetric, and non-tender without distention. No masses, hepatomegaly, or splenomegaly are noted.  Neurological: The patient is awake, alert and oriented to person, place, and time with normal speech.  Memory is normal and thought processes intact. No gait abnormalities are appreciated.  Psychiatric: Appropriate mood and affect.   Assessment Annual biometrics screen  Plan  Lipid panel pending. Encouraged routine visits with primary care provider.  Fasting blood sugar 107 today.  Patient reports that this was the case last year as well and that Dr. Greggory StallionGeorge is aware.  Reports that he is trying to lower this currently through lifestyle measures.  Advised him to discuss this further with his primary care provider.

## 2017-09-10 LAB — LIPID PANEL WITH LDL/HDL RATIO
Cholesterol, Total: 144 mg/dL (ref 100–199)
HDL: 36 mg/dL — AB (ref >39)
LDL CALC: 90 mg/dL (ref 0–99)
LDl/HDL Ratio: 2.5 ratio (ref 0.0–3.6)
TRIGLYCERIDES: 92 mg/dL (ref 0–149)
VLDL CHOLESTEROL CAL: 18 mg/dL (ref 5–40)

## 2017-12-21 DIAGNOSIS — R7303 Prediabetes: Secondary | ICD-10-CM | POA: Insufficient documentation

## 2018-01-05 DIAGNOSIS — K838 Other specified diseases of biliary tract: Secondary | ICD-10-CM | POA: Insufficient documentation

## 2019-03-20 ENCOUNTER — Other Ambulatory Visit: Payer: Self-pay

## 2019-03-20 ENCOUNTER — Ambulatory Visit: Payer: Managed Care, Other (non HMO) | Admitting: Adult Health

## 2019-03-20 ENCOUNTER — Encounter: Payer: Self-pay | Admitting: Adult Health

## 2019-03-20 VITALS — BP 122/82 | HR 72 | Temp 97.1°F | Resp 18 | Ht 72.0 in | Wt 234.0 lb

## 2019-03-20 DIAGNOSIS — I10 Essential (primary) hypertension: Secondary | ICD-10-CM | POA: Diagnosis not present

## 2019-03-20 DIAGNOSIS — E669 Obesity, unspecified: Secondary | ICD-10-CM | POA: Insufficient documentation

## 2019-03-20 DIAGNOSIS — IMO0001 Reserved for inherently not codable concepts without codable children: Secondary | ICD-10-CM | POA: Insufficient documentation

## 2019-03-20 DIAGNOSIS — G4730 Sleep apnea, unspecified: Secondary | ICD-10-CM | POA: Insufficient documentation

## 2019-03-20 DIAGNOSIS — Z008 Encounter for other general examination: Secondary | ICD-10-CM

## 2019-03-20 NOTE — Patient Instructions (Signed)
Lab order was given for [facility] so that patient can complete his labs for his primary care provider and his glucose and cholesterol for his biometric screening.  He is able to go to day of fasting, and can go a day this week. Angus Palms Alba Destine, MD- Patients primary care provider  [facility]  [address]  [place], [place] [address]  [phone]  [phone] (Fax)  Labs above were ordered by the above provider and should be faxed to the ordering provider for management after resulted. Patient is aware he needs to call his primary care within one week to review his lab results.   I will have the office call you on your glucose and cholesterol results when they return if you have not heard within 1 week please call the office.  This biometric physical is a brief physical and the only labs done are glucose and your lipid panel(cholesterol) and is  not a substitute for seeing a primary care provider for a complete annual physical. Please see a primary care physician for routine health maintenance, labs and full physical at least yearly and follow up as recommended by your provider. Provider also recommends if you do not have a primary care provider for patient to establish care as soon as possible .Patient may chose provider of choice. Also gave the [facility]  PHYSICIAN/PROVIDER  REFERRAL LINE at [phone] or web site at [email or web address] to help assist with finding a primary care doctor.  Patient verbalizes understanding that his office is acute care only and not a substitute for a primary care or for the management of chronic conditions.    Health Maintenance, Male Adopting a healthy lifestyle and getting preventive care are important in promoting health and wellness. Ask your health care provider about:  The right schedule for you to have regular tests and exams.  Things you can do on your own to prevent diseases and keep yourself healthy. What should I know  about diet, weight, and exercise? Eat a healthy diet   Eat a diet that includes plenty of vegetables, fruits, low-fat dairy products, and lean protein.  Do not eat a lot of foods that are high in solid fats, added sugars, or sodium. Maintain a healthy weight Body mass index (BMI) is a measurement that can be used to identify possible weight problems. It estimates body fat based on height and weight. Your health care provider can help determine your BMI and help you achieve or maintain a healthy weight. Get regular exercise Get regular exercise. This is one of the most important things you can do for your health. Most adults should:  Exercise for at least 150 minutes each week. The exercise should increase your heart rate and make you sweat (moderate-intensity exercise).  Do strengthening exercises at least twice a week. This is in addition to the moderate-intensity exercise.  Spend less time sitting. Even light physical activity can be beneficial. Watch cholesterol and blood lipids Have your blood tested for lipids and cholesterol at 50 years of age, then have this test every 5 years. You may need to have your cholesterol levels checked more often if:  Your lipid or cholesterol levels are high.  You are older than 50 years of age.  You are at high risk for heart disease. What should I know about cancer screening? Many types of cancers can be detected early and may often be prevented. Depending on your health history and family history, you may  need to have cancer screening at various ages. This may include screening for:  Colorectal cancer.  Prostate cancer.  Skin cancer.  Lung cancer. What should I know about heart disease, diabetes, and high blood pressure? Blood pressure and heart disease  High blood pressure causes heart disease and increases the risk of stroke. This is more likely to develop in people who have high blood pressure readings, are of African descent, or are  overweight.  Talk with your health care provider about your target blood pressure readings.  Have your blood pressure checked: ? Every 3-5 years if you are 60-25 years of age. ? Every year if you are 95 years old or older.  If you are between the ages of 24 and 72 and are a current or former smoker, ask your health care provider if you should have a one-time screening for abdominal aortic aneurysm (AAA). Diabetes Have regular diabetes screenings. This checks your fasting blood sugar level. Have the screening done:  Once every three years after age 66 if you are at a normal weight and have a low risk for diabetes.  More often and at a younger age if you are overweight or have a high risk for diabetes. What should I know about preventing infection? Hepatitis B If you have a higher risk for hepatitis B, you should be screened for this virus. Talk with your health care provider to find out if you are at risk for hepatitis B infection. Hepatitis C Blood testing is recommended for:  Everyone born from [birth year] through 1967.  Anyone with known risk factors for hepatitis C. Sexually transmitted infections (STIs)  You should be screened each year for STIs, including gonorrhea and chlamydia, if: ? You are sexually active and are younger than 50 years of age. ? You are older than 50 years of age and your health care provider tells you that you are at risk for this type of infection. ? Your sexual activity has changed since you were last screened, and you are at increased risk for chlamydia or gonorrhea. Ask your health care provider if you are at risk.  Ask your health care provider about whether you are at high risk for HIV. Your health care provider may recommend a prescription medicine to help prevent HIV infection. If you choose to take medicine to prevent HIV, you should first get tested for HIV. You should then be tested every 3 months for as long as you are taking the medicine. Follow these  instructions at home: Lifestyle  Do not use any products that contain nicotine or tobacco, such as cigarettes, e-cigarettes, and chewing tobacco. If you need help quitting, ask your health care provider.  Do not use street drugs.  Do not share needles.  Ask your health care provider for help if you need support or information about quitting drugs. Alcohol use  Do not drink alcohol if your health care provider tells you not to drink.  If you drink alcohol: ? Limit how much you have to 0-2 drinks a day. ? Be aware of how much alcohol is in your drink. In the [place] one drink equals one 12 oz bottle of beer (355 mL), one 5 oz glass of wine (148 mL), or one 1 oz glass of hard liquor (44 mL). General instructions  Schedule regular health, dental, and eye exams.  Stay current with your vaccines.  Tell your health care provider if: ? You often feel depressed. ? You have ever been abused or  do not feel safe at home. Summary  Adopting a healthy lifestyle and getting preventive care are important in promoting health and wellness.  Follow your health care provider's instructions about healthy diet, exercising, and getting tested or screened for diseases.  Follow your health care provider's instructions on monitoring your cholesterol and blood pressure. This information is not intended to replace advice given to you by your health care provider. Make sure you discuss any questions you have with your health care provider. Document Released: 11/20/2007 Document Revised: 05/17/2018 Document Reviewed: 05/17/2018 Elsevier Patient Education  2020 [employer]

## 2019-03-20 NOTE — Progress Notes (Signed)
[name]  Ronald Christensen DOB: 48 y.o. MRN: [id number]  Subjective:  Here for Biometric Screen/brief exam Patient is a 47 year old male in no acute distress who comes to the clinic for his biometric screening and brief exam for his employer [name]. 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  He sees Dr. Iona Christensen, Ronald Battiest, MD for his primary care provider and also brings in orders for labs today of which he will follow-up with his primary care provider for the results within 1 week from today's visit.  He will call the provider's office if he has not heard back from them.  Results will be faxed from this office to his primary care provider when they are returned.  He denies any concerns today regarding his health as he reports he sees his primary care regularly.  Patient  denies any fever, body aches,chills, rash, chest pain, shortness of breath, nausea, vomiting, or diarrhea.   No Known Allergies Patient Active Problem List   Diagnosis Date Noted  . Obesity (BMI 30-39.9) 03/20/2019  . Sleep apnea 03/20/2019  . White coat hypertension 03/20/2019  . Biliary sludge 01/05/2018  . Prediabetes 12/21/2017  . Essential hypertension 01/06/2016  . Achilles rupture 08/13/2015  . HLD (hyperlipidemia) 08/13/2015  . HTN, white coat 08/13/2015  . Apnea, sleep 08/13/2015  . Pulmonary embolism ([name]) 08/13/2015  . Adult BMI 30+ 08/13/2015  . BP (high blood pressure) 08/13/2015    Current Outpatient Medications:  .  atorvastatin (LIPITOR) 40 MG tablet, Take by mouth., Disp: , Rfl:  .  doxycycline (MONODOX) 100 MG capsule, Take 1 capsule (100 mg total) by mouth 2 (two) times daily., Disp: 20 capsule, Rfl: 0 .  hydrochlorothiazide (MICROZIDE) 12.5 MG capsule, Take by mouth., Disp: , Rfl:  .  lisinopril (PRINIVIL,ZESTRIL) 40 MG tablet, Take by mouth., Disp: , Rfl:  .  simvastatin (ZOCOR) 40 MG tablet, , Disp: , Rfl:    Objective: Blood  pressure 122/82, pulse 72, temperature (!) 97.1 F (36.2 C), temperature source Temporal, resp. rate 18, height 6' (1.829 m), weight 234 lb (106.1 kg), SpO2 98 %. temporal thermometer reading one degree less than oral thermometer.  Patient is alert and oriented and responsive to questions Engages in eye contact with provider. Speaks in full sentences without any pauses without any shortness of breath or distress.  NAD, well developed, well nourished  HEENT: Within normal limits Neck: Normal, supple, thyroid normal, no anterior or posterior cervical lymphadenopathy  Heart: Regular rate and rhythm without murmurs, rubs or gallops Lungs: Clear to auscultation without any adventitious lung sounds Patient moves on and off of exam table and in room without difficulty. Gait is normal in hall and in room. Patient is oriented to person place time and situation. Patient answers questions appropriately and engages in conversation.   Assessment: Biometric screen Encounter for biometric screening - Plan: Hgb A1c w/o eAG, Comprehensive metabolic panel, Glucose, random, Lipid panel, PSA, CBC with Differential  Encounter for other general examination- not a full annual physical- biometric screening with brief biometric exam  - Plan: Hgb A1c w/o eAG, Comprehensive metabolic panel, Glucose, random, Lipid panel, PSA, CBC with Differential  Essential hypertension, benign - Plan: Hgb A1c w/o eAG, Comprehensive metabolic panel, Glucose, random, Lipid panel, PSA, CBC with Differential  Ronald Christensen, Ronald Farrel Gordon, MD- Patients primary care provider  [facility]  [address]  [place], [place] [address]  [phone]  [phone] (Fax)  Labs  above were ordered by the above provider and should be faxed to the ordering provider for management after resulted. Patient is aware he needs to call his primary care within one week to review his lab results.  Plan:   Follow up with primary care as  needed for chronic and maintenance health care- can be seen in this employee clinic for acute care.   Fasting glucose and lipids. Discussed with patient that today's visit here is a limited biometric screening visit (not a comprehensive exam or management of any chronic problems) Discussed some health issues, including healthy eating habits and exercise. Encouraged to follow-up with PCP for annual comprehensive preventive and wellness care (and if applicable, any chronic issues). Questions invited and answered.

## 2019-03-30 ENCOUNTER — Other Ambulatory Visit: Payer: Self-pay | Admitting: Family Medicine

## 2019-03-31 LAB — CBC WITH DIFFERENTIAL/PLATELET
Basophils Absolute: 0.1 10*3/uL (ref 0.0–0.2)
Basos: 1 %
EOS (ABSOLUTE): 0.1 10*3/uL (ref 0.0–0.4)
Eos: 2 %
Hematocrit: 46.8 % (ref 37.5–51.0)
Hemoglobin: 15.6 g/dL (ref 13.0–17.7)
Immature Grans (Abs): 0 10*3/uL (ref 0.0–0.1)
Immature Granulocytes: 0 %
Lymphocytes Absolute: 2.4 10*3/uL (ref 0.7–3.1)
Lymphs: 33 %
MCH: 30.1 pg (ref 26.6–33.0)
MCHC: 33.3 g/dL (ref 31.5–35.7)
MCV: 90 fL (ref 79–97)
Monocytes Absolute: 0.6 10*3/uL (ref 0.1–0.9)
Monocytes: 8 %
Neutrophils Absolute: 4.2 10*3/uL (ref 1.4–7.0)
Neutrophils: 56 %
Platelets: 308 10*3/uL (ref 150–450)
RBC: 5.18 x10E6/uL (ref 4.14–5.80)
RDW: 12.7 % (ref 11.6–15.4)
WBC: 7.4 10*3/uL (ref 3.4–10.8)

## 2019-03-31 LAB — COMPREHENSIVE METABOLIC PANEL
ALT: 29 IU/L (ref 0–44)
AST: 23 IU/L (ref 0–40)
Albumin/Globulin Ratio: 2.3 — ABNORMAL HIGH (ref 1.2–2.2)
Albumin: 5.1 g/dL — ABNORMAL HIGH (ref 4.0–5.0)
Alkaline Phosphatase: 65 IU/L (ref 39–117)
BUN/Creatinine Ratio: 18 (ref 9–20)
BUN: 17 mg/dL (ref 6–24)
Bilirubin Total: 0.7 mg/dL (ref 0.0–1.2)
CO2: 21 mmol/L (ref 20–29)
Calcium: 10.1 mg/dL (ref 8.7–10.2)
Chloride: 100 mmol/L (ref 96–106)
Creatinine, Ser: 0.94 mg/dL (ref 0.76–1.27)
GFR calc Af Amer: 109 mL/min/{1.73_m2} (ref >59)
GFR calc non Af Amer: 94 mL/min/{1.73_m2} (ref >59)
Globulin, Total: 2.2 g/dL (ref 1.5–4.5)
Glucose: 115 mg/dL — ABNORMAL HIGH (ref 65–99)
Potassium: 4.5 mmol/L (ref 3.5–5.2)
Sodium: 137 mmol/L (ref 134–144)
Total Protein: 7.3 g/dL (ref 6.0–8.5)

## 2019-03-31 LAB — LIPID PANEL WITH LDL/HDL RATIO
Cholesterol, Total: 190 mg/dL (ref 100–199)
HDL: 51 mg/dL (ref >39)
LDL Chol Calc (NIH): 116 mg/dL — ABNORMAL HIGH (ref 0–99)
LDL/HDL Ratio: 2.3 ratio (ref 0.0–3.6)
Triglycerides: 131 mg/dL (ref 0–149)
VLDL Cholesterol Cal: 23 mg/dL (ref 5–40)

## 2019-03-31 LAB — PSA: Prostate Specific Ag, Serum: 0.7 ng/mL (ref 0.0–4.0)

## 2019-03-31 LAB — HGB A1C W/O EAG: Hgb A1c MFr Bld: 5.9 % — ABNORMAL HIGH (ref 4.8–5.6)

## 2019-04-03 ENCOUNTER — Other Ambulatory Visit: Payer: Self-pay

## 2019-05-18 ENCOUNTER — Other Ambulatory Visit: Payer: Self-pay

## 2019-05-18 DIAGNOSIS — Z20822 Contact with and (suspected) exposure to covid-19: Secondary | ICD-10-CM

## 2019-05-19 LAB — NOVEL CORONAVIRUS, NAA: SARS-CoV-2, NAA: NOT DETECTED

## 2019-11-27 ENCOUNTER — Encounter: Payer: Managed Care, Other (non HMO) | Admitting: Nurse Practitioner

## 2019-12-03 ENCOUNTER — Encounter: Payer: Managed Care, Other (non HMO) | Admitting: Physician Assistant

## 2019-12-18 ENCOUNTER — Encounter: Payer: Managed Care, Other (non HMO) | Admitting: Physician Assistant

## 2020-01-01 ENCOUNTER — Encounter: Payer: Managed Care, Other (non HMO) | Admitting: Physician Assistant

## 2020-01-18 ENCOUNTER — Encounter: Payer: Self-pay | Admitting: Emergency Medicine

## 2020-01-18 ENCOUNTER — Other Ambulatory Visit: Payer: Self-pay

## 2020-01-18 ENCOUNTER — Other Ambulatory Visit: Payer: Managed Care, Other (non HMO)

## 2020-01-18 ENCOUNTER — Ambulatory Visit: Payer: Managed Care, Other (non HMO) | Admitting: Emergency Medicine

## 2020-01-18 VITALS — BP 122/70 | HR 74 | Temp 97.6°F | Resp 16 | Ht 72.0 in | Wt 236.0 lb

## 2020-01-18 DIAGNOSIS — E785 Hyperlipidemia, unspecified: Secondary | ICD-10-CM

## 2020-01-18 DIAGNOSIS — Z0000 Encounter for general adult medical examination without abnormal findings: Secondary | ICD-10-CM | POA: Diagnosis not present

## 2020-01-18 DIAGNOSIS — I10 Essential (primary) hypertension: Secondary | ICD-10-CM

## 2020-01-18 DIAGNOSIS — Z008 Encounter for other general examination: Secondary | ICD-10-CM | POA: Diagnosis not present

## 2020-01-18 DIAGNOSIS — Z125 Encounter for screening for malignant neoplasm of prostate: Secondary | ICD-10-CM | POA: Diagnosis not present

## 2020-01-18 NOTE — Progress Notes (Signed)
  I have reviewed the triage vital signs and the nursing notes.   HISTORY  Chief Complaint Physical Exam  HPI Ronald Christensen is a 49 y.o. male presents to clinic for an annual physical.  No complaints.      No past medical history on file.  Patient Active Problem List   Diagnosis Date Noted  . Obesity (BMI 30-39.9) 03/20/2019  . Sleep apnea 03/20/2019  . White coat hypertension 03/20/2019  . Biliary sludge 01/05/2018  . Prediabetes 12/21/2017  . Essential hypertension 01/06/2016  . Achilles rupture 08/13/2015  . HLD (hyperlipidemia) 08/13/2015  . HTN, white coat 08/13/2015  . Apnea, sleep 08/13/2015  . Pulmonary embolism ([facility]) 08/13/2015  . Adult BMI 30+ 08/13/2015  . BP (high blood pressure) 08/13/2015    No past surgical history on file.  Prior to Admission medications   Medication Sig Start Date End Date Taking? Authorizing Provider  atorvastatin (LIPITOR) 40 MG tablet Take by mouth. 03/07/19 12/07/19  [provider]  doxycycline (MONODOX) 100 MG capsule Take 1 capsule (100 mg total) by mouth 2 (two) times daily. 02/09/17   Joni Reining, PA-C  hydrochlorothiazide (MICROZIDE) 12.5 MG capsule Take by mouth. 08/26/14   [provider]  lisinopril (PRINIVIL,ZESTRIL) 40 MG tablet Take by mouth. 08/26/14   [provider]  simvastatin (ZOCOR) 40 MG tablet  09/24/15   [provider]    Allergies Patient has no known allergies.  No family history on file.  Social History Social History   Tobacco Use  . Smoking status: Unknown If Ever Smoked  . Smokeless tobacco: Never Used  Substance Use Topics  . Alcohol use: Not on file  . Drug use: Not on file    Review of Systems Constitutional: No fever/chills Eyes: No visual changes. Cardiovascular: Denies chest pain. Respiratory: Denies shortness of breath. Gastrointestinal: No abdominal pain.  No nausea, no vomiting.   Musculoskeletal: Negative for muscle or joint pain Skin: Negative  for rash. Neurological: Negative for  focal weakness or numbness. ____________________________________________   PHYSICAL EXAM:  VITAL SIGNS: Constitutional: Alert and oriented. Well appearing and in no acute distress. Eyes: Conjunctivae are normal.  Head: Atraumatic. Nose: No congestion/rhinnorhea. Neck: No stridor.No cervical tenderness to palpation posteriorly. Cardiovascular: Normal rate, regular rhythm. Grossly normal heart sounds.  Good peripheral circulation. Respiratory: Normal respiratory effort.  No retractions. Lungs CTAB. Gastrointestinal: Soft and nontender. No distention.  BS normoactive x 4 quads. Musculoskeletal: Non tender thoracic and lumbar spine to palpation.  Moves upper and lower extremities without any difficulty.  No edema lower extremities. Neurologic:  Normal speech and language. No gross focal neurologic deficits are appreciated. No gait instability. Skin:  Skin is warm, dry and intact. No rash noted. Psychiatric: Mood and affect are normal. Speech and behavior are normal.  ____________________________________________   LABS (all labs ordered are listed, but only abnormal results are displayed)  pending ____________________________________________  EKG   ____________________________________________    FINAL CLINICAL IMPRESSION(S)  Normal physical exam   ED Discharge Orders    None       Note:  This document was prepared using Dragon voice recognition software and may include unintentional dictation errors.

## 2020-01-19 LAB — COMPREHENSIVE METABOLIC PANEL
ALT: 23 IU/L (ref 0–44)
AST: 19 IU/L (ref 0–40)
Albumin/Globulin Ratio: 2.7 — ABNORMAL HIGH (ref 1.2–2.2)
Albumin: 4.9 g/dL (ref 3.8–4.9)
Alkaline Phosphatase: 66 IU/L (ref 48–121)
BUN/Creatinine Ratio: 19 (ref 9–20)
BUN: 16 mg/dL (ref 6–24)
Bilirubin Total: 0.7 mg/dL (ref 0.0–1.2)
CO2: 22 mmol/L (ref 20–29)
Calcium: 9.8 mg/dL (ref 8.7–10.2)
Chloride: 102 mmol/L (ref 96–106)
Creatinine, Ser: 0.86 mg/dL (ref 0.76–1.27)
GFR calc Af Amer: 116 mL/min/{1.73_m2} (ref >59)
GFR calc non Af Amer: 100 mL/min/{1.73_m2} (ref >59)
Globulin, Total: 1.8 g/dL (ref 1.5–4.5)
Glucose: 115 mg/dL — ABNORMAL HIGH (ref 65–99)
Potassium: 4.5 mmol/L (ref 3.5–5.2)
Sodium: 138 mmol/L (ref 134–144)
Total Protein: 6.7 g/dL (ref 6.0–8.5)

## 2020-01-19 LAB — CBC WITH DIFFERENTIAL/PLATELET
Basophils Absolute: 0 10*3/uL (ref 0.0–0.2)
Basos: 1 %
EOS (ABSOLUTE): 0.2 10*3/uL (ref 0.0–0.4)
Eos: 3 %
Hematocrit: 44.2 % (ref 37.5–51.0)
Hemoglobin: 14.3 g/dL (ref 13.0–17.7)
Immature Grans (Abs): 0 10*3/uL (ref 0.0–0.1)
Immature Granulocytes: 0 %
Lymphocytes Absolute: 2.2 10*3/uL (ref 0.7–3.1)
Lymphs: 31 %
MCH: 30.2 pg (ref 26.6–33.0)
MCHC: 32.4 g/dL (ref 31.5–35.7)
MCV: 93 fL (ref 79–97)
Monocytes Absolute: 0.6 10*3/uL (ref 0.1–0.9)
Monocytes: 8 %
Neutrophils Absolute: 4 10*3/uL (ref 1.4–7.0)
Neutrophils: 57 %
Platelets: 299 10*3/uL (ref 150–450)
RBC: 4.73 x10E6/uL (ref 4.14–5.80)
RDW: 12.7 % (ref 11.6–15.4)
WBC: 7 10*3/uL (ref 3.4–10.8)

## 2020-01-19 LAB — PSA: Prostate Specific Ag, Serum: 0.6 ng/mL (ref 0.0–4.0)

## 2020-01-19 LAB — LIPID PANEL
Chol/HDL Ratio: 3.4 ratio (ref 0.0–5.0)
Cholesterol, Total: 161 mg/dL (ref 100–199)
HDL: 47 mg/dL (ref >39)
LDL Chol Calc (NIH): 92 mg/dL (ref 0–99)
Triglycerides: 120 mg/dL (ref 0–149)
VLDL Cholesterol Cal: 22 mg/dL (ref 5–40)

## 2020-03-25 ENCOUNTER — Ambulatory Visit: Payer: Managed Care, Other (non HMO)

## 2020-03-25 ENCOUNTER — Other Ambulatory Visit: Payer: Self-pay

## 2020-03-25 DIAGNOSIS — Z23 Encounter for immunization: Secondary | ICD-10-CM

## 2020-06-16 ENCOUNTER — Ambulatory Visit: Payer: Managed Care, Other (non HMO) | Admitting: Physician Assistant

## 2023-11-29 ENCOUNTER — Inpatient Hospital Stay: Admission: RE | Admit: 2023-11-29 | Source: Ambulatory Visit

## 2023-12-07 ENCOUNTER — Encounter: Admission: RE | Payer: Self-pay | Source: Home / Self Care

## 2023-12-07 ENCOUNTER — Ambulatory Visit: Admission: RE | Admit: 2023-12-07 | Source: Home / Self Care | Admitting: General Surgery

## 2023-12-07 SURGERY — HEMORRHOIDECTOMY
Anesthesia: General | Site: Rectum
